# Patient Record
Sex: Male | Born: 1960 | Race: White | Hispanic: No | Marital: Married | Smoking: Current every day smoker
Health system: Southern US, Community
[De-identification: ages and names within clinical notes are randomized; demographics above are authoritative.]

## PROBLEM LIST (undated history)

## (undated) HISTORY — PX: LITHOTRIPSY: SUR834

---

## 2012-12-03 ENCOUNTER — Encounter: Payer: Self-pay | Admitting: Sports Medicine

## 2012-12-03 ENCOUNTER — Ambulatory Visit (INDEPENDENT_AMBULATORY_CARE_PROVIDER_SITE_OTHER): Admitting: Sports Medicine

## 2012-12-03 VITALS — BP 134/90 | HR 118 | Ht 68.0 in | Wt 193.0 lb

## 2012-12-03 DIAGNOSIS — R635 Abnormal weight gain: Secondary | ICD-10-CM | POA: Insufficient documentation

## 2012-12-03 DIAGNOSIS — K602 Anal fissure, unspecified: Secondary | ICD-10-CM | POA: Insufficient documentation

## 2012-12-03 DIAGNOSIS — Z299 Encounter for prophylactic measures, unspecified: Secondary | ICD-10-CM | POA: Insufficient documentation

## 2012-12-03 DIAGNOSIS — M25529 Pain in unspecified elbow: Secondary | ICD-10-CM

## 2012-12-03 DIAGNOSIS — F17200 Nicotine dependence, unspecified, uncomplicated: Secondary | ICD-10-CM | POA: Insufficient documentation

## 2012-12-03 DIAGNOSIS — M25522 Pain in left elbow: Secondary | ICD-10-CM | POA: Insufficient documentation

## 2012-12-03 MED ORDER — HYDROCORTISONE ACETATE 25 MG RE SUPP: 25.0000 mg | Freq: Two times a day (BID) | RECTAL | Status: DC | PRN

## 2012-12-03 MED ORDER — MELOXICAM 15 MG PO TABS: ORAL_TABLET | ORAL | Status: DC

## 2012-12-03 MED ORDER — PREDNISONE 50 MG PO TABS: ORAL_TABLET | ORAL | Status: DC

## 2012-12-03 MED ORDER — BUPROPION HCL ER (XL) 150 MG PO TB24: 150.0000 mg | ORAL_TABLET | ORAL | Status: DC

## 2012-12-03 MED ORDER — HYDROCORTISONE ACE-PRAMOXINE 1-1 % RE FOAM: 1.0000 | Freq: Two times a day (BID) | RECTAL | Status: DC

## 2012-12-03 NOTE — Assessment & Plan Note (Signed)
Strap with compressive bandage. Mobic, prednisone. X-rays. Return in 2-3 weeks, if no better I will inject the elbow joint.

## 2012-12-03 NOTE — Assessment & Plan Note (Signed)
Continue MiraLax, adding Anusol suppositories and ProctoFoam.

## 2012-12-03 NOTE — Assessment & Plan Note (Signed)
Colonoscopy was normal 5 months ago. Checking routine blood work.

## 2012-12-03 NOTE — Progress Notes (Signed)
  Subjective:    CC: Establish care.   HPI:  Left elbow pain: Localized between the epicondyles of the radial head, getting worse over the past week, no trauma, no overuse. No prior URI symptoms, has been using some Percocet that he had left over. It is localized, doesn't radiate, moderate.  Smoker: One pack a day, eager to quit, failed Chantix.  Anal fissure: Resolved in the past with Anusol suppositories and some kind of anal cream.  Weight gain: Has been prescribed phentermine by another provider, he is not obese.  Past medical history, Surgical history, Family history not pertinant except as noted below, Social history, Allergies, and medications have been entered into the medical record, reviewed, and no changes needed.   Review of Systems: No headache, visual changes, nausea, vomiting, diarrhea, constipation, dizziness, abdominal pain, skin rash, fevers, chills, night sweats, swollen lymph nodes, weight loss, chest pain, body aches, joint swelling, muscle aches, shortness of breath, mood changes, visual or auditory hallucinations.  Objective:    General: Well Developed, well nourished, and in no acute distress.  Neuro: Alert and oriented x3, extra-ocular muscles intact, sensation grossly intact.  HEENT: Normocephalic, atraumatic, pupils equal round reactive to light, neck supple, no masses, no lymphadenopathy, thyroid nonpalpable.  Skin: Warm and dry, no rashes noted.  Cardiac: Regular rate and rhythm, no murmurs rubs or gallops.  Respiratory: Clear to auscultation bilaterally. Not using accessory muscles, speaking in full sentences.  Abdominal: Soft, nontender, nondistended, positive bowel sounds, no masses, no organomegaly.  Left Elbow: Mildly full to inspection. Extension has about 5 of lag limited by pain. Strength is full to all of the above directions Stable to varus, valgus stress. Negative moving valgus stress test. Minimally tender to palpation at the joint line. Ulnar  nerve does not sublux. Negative cubital tunnel Tinel's. Impression and Recommendations:    The patient was counselled, risk factors were discussed, anticipatory guidance given.

## 2012-12-03 NOTE — Assessment & Plan Note (Signed)
Starting Wellbutrin XL at 150 mg daily. Has already trying Chantix and nicotine replacement.

## 2012-12-03 NOTE — Assessment & Plan Note (Signed)
Though he is not obese, he is being prescribed phentermine by a local physician.

## 2012-12-14 LAB — LIPID PANEL
Cholesterol: 148 mg/dL (ref 0–200)
HDL: 36 mg/dL — ABNORMAL LOW (ref >39)
LDL Cholesterol: 87 mg/dL (ref 0–99)
Total CHOL/HDL Ratio: 4.1 Ratio
Triglycerides: 124 mg/dL (ref <150)
VLDL: 25 mg/dL (ref 0–40)

## 2012-12-14 LAB — COMPREHENSIVE METABOLIC PANEL
Albumin: 4.2 g/dL (ref 3.5–5.2)
BUN: 16 mg/dL (ref 6–23)
CO2: 24 mEq/L (ref 19–32)
Calcium: 9.6 mg/dL (ref 8.4–10.5)
Chloride: 104 mEq/L (ref 96–112)
Creat: 1.05 mg/dL (ref 0.50–1.35)
Glucose, Bld: 82 mg/dL (ref 70–99)
Potassium: 4.3 mEq/L (ref 3.5–5.3)

## 2012-12-14 LAB — CBC
HCT: 46.5 % (ref 39.0–52.0)
Hemoglobin: 16 g/dL (ref 13.0–17.0)
MCH: 31.3 pg (ref 26.0–34.0)
MCHC: 34.4 g/dL (ref 30.0–36.0)
MCV: 91 fL (ref 78.0–100.0)
Platelets: 185 K/uL (ref 150–400)
RBC: 5.11 MIL/uL (ref 4.22–5.81)
RDW: 13.4 % (ref 11.5–15.5)
WBC: 9.3 10*3/uL (ref 4.0–10.5)

## 2012-12-14 LAB — COMPREHENSIVE METABOLIC PANEL WITH GFR
ALT: 15 U/L (ref 0–53)
AST: 13 U/L (ref 0–37)
Alkaline Phosphatase: 42 U/L (ref 39–117)
Sodium: 138 meq/L (ref 135–145)
Total Bilirubin: 0.7 mg/dL (ref 0.3–1.2)
Total Protein: 6.9 g/dL (ref 6.0–8.3)

## 2012-12-14 LAB — TSH: TSH: 1.48 u[IU]/mL (ref 0.350–4.500)

## 2012-12-15 LAB — VITAMIN D 25 HYDROXY (VIT D DEFICIENCY, FRACTURES): Vit D, 25-Hydroxy: 30 ng/mL (ref 30–89)

## 2012-12-15 LAB — TESTOSTERONE, FREE, TOTAL, SHBG
Sex Hormone Binding: 32 nmol/L (ref 13–71)
Testosterone, Free: 89.4 pg/mL (ref 47.0–244.0)
Testosterone-% Free: 2.1 % (ref 1.6–2.9)
Testosterone: 428 ng/dL (ref 300–890)

## 2012-12-16 ENCOUNTER — Ambulatory Visit (HOSPITAL_BASED_OUTPATIENT_CLINIC_OR_DEPARTMENT_OTHER)
Admission: RE | Admit: 2012-12-16 | Discharge: 2012-12-16 | Disposition: A | Discharge status: Home / Self Care | Source: Ambulatory Visit | Attending: Sports Medicine | Admitting: Sports Medicine

## 2012-12-16 DIAGNOSIS — M25529 Pain in unspecified elbow: Secondary | ICD-10-CM | POA: Insufficient documentation

## 2012-12-16 DIAGNOSIS — M25522 Pain in left elbow: Secondary | ICD-10-CM

## 2012-12-17 ENCOUNTER — Encounter: Payer: Self-pay | Admitting: Sports Medicine

## 2012-12-17 ENCOUNTER — Ambulatory Visit (INDEPENDENT_AMBULATORY_CARE_PROVIDER_SITE_OTHER): Admitting: Sports Medicine

## 2012-12-17 VITALS — BP 130/90 | HR 108 | Wt 188.0 lb

## 2012-12-17 DIAGNOSIS — K602 Anal fissure, unspecified: Secondary | ICD-10-CM

## 2012-12-17 DIAGNOSIS — M25522 Pain in left elbow: Secondary | ICD-10-CM

## 2012-12-17 DIAGNOSIS — M25529 Pain in unspecified elbow: Secondary | ICD-10-CM

## 2012-12-17 MED ORDER — HYDROCORTISONE ACE-PRAMOXINE 1-1 % RE FOAM: 1.0000 | Freq: Two times a day (BID) | RECTAL | Status: DC

## 2012-12-17 NOTE — Assessment & Plan Note (Signed)
Improve significantly with prednisone, compression. Still has minimal pain, and some popping. Elbow joint injected as above. Return in one month, MRI if no better.

## 2012-12-17 NOTE — Progress Notes (Signed)
  Subjective:    CC: Followup  HPI: This very pleasant 52 year old male comes back for followup of his elbow pain. His pain is significantly better, after prednisone and compressive wrap, he still notes some popping and catching. He denies any trauma. Symptoms are localized, stable, no radiation, mild.  Anal fissure: Improved significantly with ProctoFoam, still looking for the other cream that he got from another provider. He will let me know.  Past medical history, Surgical history, Family history not pertinant except as noted below, Social history, Allergies, and medications have been entered into the medical record, reviewed, and no changes needed.   Review of Systems: No fevers, chills, night sweats, weight loss, chest pain, or shortness of breath.   Objective:    General: Well Developed, well nourished, and in no acute distress.  Neuro: Alert and oriented x3, extra-ocular muscles intact, sensation grossly intact.  HEENT: Normocephalic, atraumatic, pupils equal round reactive to light, neck supple, no masses, no lymphadenopathy, thyroid nonpalpable.  Skin: Warm and dry, no rashes. Cardiac: Regular rate and rhythm, no murmurs rubs or gallops, no lower extremity edema.  Respiratory: Clear to auscultation bilaterally. Not using accessory muscles, speaking in full sentences. Left Elbow: Unremarkable to inspection. Range of motion full pronation, supination, flexion, extension. Strength is full to all of the above directions Stable to varus, valgus stress. Negative moving valgus stress test. No discrete areas of tenderness to palpation. Ulnar nerve does not sublux. Negative cubital tunnel Tinel's.  Procedure:  Injection of left elbow Consent obtained and verified. Time-out conducted. Noted no overlying erythema, induration, or other signs of local infection. Skin prepped in a sterile fashion. Topical analgesic spray: Ethyl chloride. Completed without difficulty. Meds: Needle  advanced through anconeus muscle between the radial head and olecranon.  1 cc Kenalog 40, 3 cc lidocaine injected easily. Pain immediately improved suggesting accurate placement of the medication. Advised to call if fevers/chills, erythema, induration, drainage, or persistent bleeding.  Impression and Recommendations:

## 2012-12-17 NOTE — Assessment & Plan Note (Signed)
Doing better, needs refill on proctofoam. Getting the name of the other topical medication from his surgeons.

## 2012-12-18 ENCOUNTER — Telehealth: Payer: Self-pay | Admitting: *Deleted

## 2012-12-18 MED ORDER — NIFEDIPINE POWD: Status: DC

## 2012-12-18 MED ORDER — HYDROCORTISONE ACE-PRAMOXINE 2.5-1 % RE CREA: TOPICAL_CREAM | Freq: Three times a day (TID) | RECTAL | Status: DC

## 2012-12-18 NOTE — Telephone Encounter (Signed)
Pt states you said if he called back with the name of these meds that you would call them in for him. States to call it in to [facility] b/c they do compound meds.  Nifedipine 0.2% TID and Ana;pram 2.5% 5 times daily.

## 2012-12-18 NOTE — Telephone Encounter (Signed)
Done

## 2013-01-25 ENCOUNTER — Ambulatory Visit: Admitting: Sports Medicine

## 2013-01-25 DIAGNOSIS — Z0289 Encounter for other administrative examinations: Secondary | ICD-10-CM

## 2013-10-18 ENCOUNTER — Ambulatory Visit (INDEPENDENT_AMBULATORY_CARE_PROVIDER_SITE_OTHER)

## 2013-10-18 ENCOUNTER — Ambulatory Visit (INDEPENDENT_AMBULATORY_CARE_PROVIDER_SITE_OTHER): Admitting: Sports Medicine

## 2013-10-18 ENCOUNTER — Encounter: Payer: Self-pay | Admitting: Sports Medicine

## 2013-10-18 VITALS — BP 138/98 | HR 103 | Ht 69.0 in | Wt 201.0 lb

## 2013-10-18 DIAGNOSIS — M25519 Pain in unspecified shoulder: Secondary | ICD-10-CM

## 2013-10-18 DIAGNOSIS — M25512 Pain in left shoulder: Secondary | ICD-10-CM | POA: Insufficient documentation

## 2013-10-18 DIAGNOSIS — M47812 Spondylosis without myelopathy or radiculopathy, cervical region: Secondary | ICD-10-CM

## 2013-10-18 MED ORDER — PREDNISONE 50 MG PO TABS: ORAL_TABLET | ORAL | Status: DC

## 2013-10-18 MED ORDER — CYCLOBENZAPRINE HCL 10 MG PO TABS: ORAL_TABLET | ORAL | Status: DC

## 2013-10-18 NOTE — Progress Notes (Deleted)

## 2013-10-18 NOTE — Assessment & Plan Note (Signed)
Positive whistle blower and crank tests suggest labral pathology. Symptoms radiating down the arm in a C7 nerve combined with triceps weakness also suggests C7 radiculopathy. At this point we are going to obtain x-rays of the shoulder and cervical spine, formal physical therapy, prednisone, Flexeril. Return to see me in one month, advanced imaging and intervention will depend predominately on how the symptoms have localized.

## 2013-10-18 NOTE — Progress Notes (Signed)
  Subjective:    CC: L shoulder pain  HPI: Patient is a pleasant 53 yo male with a 5 week history of left shoulder pain. The pain has gotten worse in the past week and a half. He describes the pain as a throbbing pain with the weight of his arm pulling down causing most of the pain. He also describes intermittent sharp stabbing pain and muscle spasm, which comes down his arm to the 3rd and 4th phalanges. He is most comfortable with his arm overhead. He has tried to put his arm in a sling for a few days with no relief. Denies neck pain, numbness, or tingling.   Past medical history, Surgical history, Family history not pertinant except as noted below, Social history, Allergies, and medications have been entered into the medical record, reviewed, and no changes needed.   Review of Systems: No fevers, chills, night sweats, weight loss, chest pain, or shortness of breath.   Objective:    General: Well Developed, well nourished, and in no acute distress.  Neuro: Alert and oriented x3, extra-ocular muscles intact, sensation grossly intact.  HEENT: Normocephalic, atraumatic, pupils equal round reactive to light, neck supple, no masses, no lymphadenopathy, thyroid nonpalpable.  Skin: Warm and dry, no rashes. Cardiac: Regular rate and rhythm, no murmurs rubs or gallops, no lower extremity edema.  Respiratory: Clear to auscultation bilaterally. Not using accessory muscles, speaking in full sentences. Neck: Inspection unremarkable. No palpable stepoffs. Negative Spurling's maneuver. Full neck range of motion Grip strength and sensation normal in bilateral hands Strength weak to left tricep/C7 Negative Hoffman sign bilaterally Reflexes normal Left Shoulder: Inspection reveals no abnormalities, atrophy or asymmetry. Palpation is normal with no tenderness over AC joint or bicipital groove. ROM is full in all planes. Rotator cuff strength normal throughout. No signs of impingement with negative  Neer and Hawkin's tests, empty can sign. Speeds and Yergason's tests normal. Positive crank test, negative O'Brien's test, negative CLOtest, 1+ positive anterior translational instability. Normal scapular function observed. No painful arc and no drop arm sign. No apprehension sign  Shoulder x-rays are unremarkable, neck x-rays do show C6-C7 degenerative changes.  Impression and Recommendations:

## 2013-10-28 ENCOUNTER — Ambulatory Visit (INDEPENDENT_AMBULATORY_CARE_PROVIDER_SITE_OTHER): Admitting: Physical Therapy

## 2013-10-28 DIAGNOSIS — M25519 Pain in unspecified shoulder: Secondary | ICD-10-CM

## 2013-10-28 DIAGNOSIS — M6281 Muscle weakness (generalized): Secondary | ICD-10-CM

## 2013-10-28 DIAGNOSIS — R293 Abnormal posture: Secondary | ICD-10-CM

## 2013-11-04 ENCOUNTER — Encounter (INDEPENDENT_AMBULATORY_CARE_PROVIDER_SITE_OTHER): Admitting: Physical Therapy

## 2013-11-04 DIAGNOSIS — R293 Abnormal posture: Secondary | ICD-10-CM

## 2013-11-04 DIAGNOSIS — M25519 Pain in unspecified shoulder: Secondary | ICD-10-CM

## 2013-11-04 DIAGNOSIS — M6281 Muscle weakness (generalized): Secondary | ICD-10-CM

## 2013-11-11 ENCOUNTER — Telehealth: Payer: Self-pay | Admitting: *Deleted

## 2013-11-11 ENCOUNTER — Ambulatory Visit (INDEPENDENT_AMBULATORY_CARE_PROVIDER_SITE_OTHER): Admitting: Sports Medicine

## 2013-11-11 ENCOUNTER — Encounter

## 2013-11-11 ENCOUNTER — Encounter: Payer: Self-pay | Admitting: Sports Medicine

## 2013-11-11 VITALS — BP 127/78 | HR 94 | Ht 69.0 in | Wt 201.0 lb

## 2013-11-11 DIAGNOSIS — M25519 Pain in unspecified shoulder: Secondary | ICD-10-CM

## 2013-11-11 DIAGNOSIS — M25512 Pain in left shoulder: Secondary | ICD-10-CM

## 2013-11-11 NOTE — Telephone Encounter (Signed)
No PA needed for MRI LT Shoulder.  Meyer CoryMisty Ahmad, LPN

## 2013-11-11 NOTE — Assessment & Plan Note (Signed)
I think Carl Payne had both left cervical radiculopathy as well as internal derangement of the shoulder joint. His radicular symptoms have resolved with prednisone and Flexeril however he continues to have posterior joint line pain. He does have several positive labile signs including a positive crank test, positive O'Brien test, and positive clunk test. At this point we do need an MR arthrogram of his left shoulder. I'm going to order the MRI and we need to schedule him one hour before the MRI for intra-articular injection.

## 2013-11-11 NOTE — Progress Notes (Signed)
  Subjective:    CC: Followup  HPI: Left shoulder pain: Initial diagnosis was difficult, he had left C7 radicular symptoms, as well as cervical degenerative changes on x-ray, he also had some positive labral signs on shoulder exam. We treated him aggressively with steroids, muscle relaxers, NSAIDs. We also did some rehabilitation. All radicular symptoms have resolved, unfortunately he continues to have pain of the posterior joint line of his left shoulder. It's worse with reaching-type activities and he does get mechanical symptoms. Moderate, persistent.  Past medical history, Surgical history, Family history not pertinant except as noted below, Social history, Allergies, and medications have been entered into the medical record, reviewed, and no changes needed.   Review of Systems: No fevers, chills, night sweats, weight loss, chest pain, or shortness of breath.   Objective:    General: Well Developed, well nourished, and in no acute distress.  Neuro: Alert and oriented x3, extra-ocular muscles intact, sensation grossly intact.  HEENT: Normocephalic, atraumatic, pupils equal round reactive to light, neck supple, no masses, no lymphadenopathy, thyroid nonpalpable.  Skin: Warm and dry, no rashes. Cardiac: Regular rate and rhythm, no murmurs rubs or gallops, no lower extremity edema.  Respiratory: Clear to auscultation bilaterally. Not using accessory muscles, speaking in full sentences. Left Shoulder: Inspection reveals no abnormalities, atrophy or asymmetry. Palpation is normal with no tenderness over AC joint or bicipital groove. ROM is full in all planes. Rotator cuff strength normal throughout. No signs of impingement with negative Neer and Hawkin's tests, empty can sign. Speeds and Yergason's tests normal. Positive O'Brien's test, positive clunk test, positive crank test Negative apprehension sign.   Impression and Recommendations:

## 2013-11-12 ENCOUNTER — Ambulatory Visit: Admitting: Sports Medicine

## 2013-12-01 ENCOUNTER — Encounter: Payer: Self-pay | Admitting: Sports Medicine

## 2013-12-01 ENCOUNTER — Ambulatory Visit (INDEPENDENT_AMBULATORY_CARE_PROVIDER_SITE_OTHER): Admitting: Sports Medicine

## 2013-12-01 ENCOUNTER — Ambulatory Visit (INDEPENDENT_AMBULATORY_CARE_PROVIDER_SITE_OTHER)

## 2013-12-01 VITALS — BP 143/87 | HR 105 | Ht 68.0 in | Wt 201.0 lb

## 2013-12-01 DIAGNOSIS — M25512 Pain in left shoulder: Secondary | ICD-10-CM

## 2013-12-01 DIAGNOSIS — M25519 Pain in unspecified shoulder: Secondary | ICD-10-CM

## 2013-12-01 DIAGNOSIS — X58XXXA Exposure to other specified factors, initial encounter: Secondary | ICD-10-CM

## 2013-12-01 DIAGNOSIS — S46819A Strain of other muscles, fascia and tendons at shoulder and upper arm level, unspecified arm, initial encounter: Secondary | ICD-10-CM

## 2013-12-01 DIAGNOSIS — S43499A Other sprain of unspecified shoulder joint, initial encounter: Secondary | ICD-10-CM

## 2013-12-01 NOTE — Progress Notes (Signed)
   Procedure: Real-time Ultrasound Guided gadolinium contrast injection of  Device: GE Logiq E  Verbal informed consent obtained.  Time-out conducted.  Noted no overlying erythema, induration, or other signs of local infection.  Skin prepped in a sterile fashion.  Local anesthesia: Topical Ethyl chloride.  With sterile technique and under real time ultrasound guidance:  Spinal needle advanced to the glenohumeral joint, 1 cc Kenalog 40, 4 cc lidocaine injected easily, and capsule seen distending confirming placement of medication, syringe switched and 0.1 cc of dilute gadolinium injected into the joint, syringe again switched and approximately 5 cc of normal saline used to flush the needle. Joint visualized and capsule seen distending confirming intra-articular placement of contrast material and medication. Completed without difficulty  Advised to call if fevers/chills, erythema, induration, drainage, or persistent bleeding.  Images permanently stored and available for review in the ultrasound unit.  Impression: Technically successful ultrasound guided gadolinium contrast injection for MR arthrography.  Please see separate MR arthrogram report.

## 2013-12-01 NOTE — Assessment & Plan Note (Signed)
Carl Payne had both radicular signs and intrinsic shoulder signs. Radicular symptoms resolved with steroids and muscle relaxers, he did have persistent labral signs. Injection for MR arthrogram performed today, we will await official report.

## 2014-07-12 ENCOUNTER — Ambulatory Visit: Admitting: Sports Medicine

## 2014-07-19 ENCOUNTER — Ambulatory Visit: Admitting: Sports Medicine

## 2014-07-20 ENCOUNTER — Ambulatory Visit (INDEPENDENT_AMBULATORY_CARE_PROVIDER_SITE_OTHER): Admitting: Sports Medicine

## 2014-07-20 ENCOUNTER — Encounter: Payer: Self-pay | Admitting: Sports Medicine

## 2014-07-20 VITALS — BP 140/92 | HR 98 | Ht 69.0 in | Wt 200.0 lb

## 2014-07-20 DIAGNOSIS — M25512 Pain in left shoulder: Secondary | ICD-10-CM

## 2014-07-20 DIAGNOSIS — N529 Male erectile dysfunction, unspecified: Secondary | ICD-10-CM | POA: Insufficient documentation

## 2014-07-20 DIAGNOSIS — N528 Other male erectile dysfunction: Secondary | ICD-10-CM

## 2014-07-20 DIAGNOSIS — H6992 Unspecified Eustachian tube disorder, left ear: Secondary | ICD-10-CM

## 2014-07-20 DIAGNOSIS — H6982 Other specified disorders of Eustachian tube, left ear: Secondary | ICD-10-CM

## 2014-07-20 DIAGNOSIS — E785 Hyperlipidemia, unspecified: Secondary | ICD-10-CM | POA: Insufficient documentation

## 2014-07-20 DIAGNOSIS — H6990 Unspecified Eustachian tube disorder, unspecified ear: Secondary | ICD-10-CM | POA: Insufficient documentation

## 2014-07-20 DIAGNOSIS — H6980 Other specified disorders of Eustachian tube, unspecified ear: Secondary | ICD-10-CM | POA: Insufficient documentation

## 2014-07-20 MED ORDER — SILDENAFIL CITRATE 20 MG PO TABS: 20.0000 mg | ORAL_TABLET | ORAL | Status: DC | PRN

## 2014-07-20 MED ORDER — FLUTICASONE PROPIONATE 50 MCG/ACT NA SUSP: NASAL | Status: DC

## 2014-07-20 MED ORDER — ATORVASTATIN CALCIUM 20 MG PO TABS: 20.0000 mg | ORAL_TABLET | Freq: Every day | ORAL | Status: DC

## 2014-07-20 MED ORDER — PREDNISONE 50 MG PO TABS: 50.0000 mg | ORAL_TABLET | Freq: Every day | ORAL | Status: DC

## 2014-07-20 NOTE — Assessment & Plan Note (Signed)
Tested in an outside facility, LDL 150, total cholesterol 220, HDL 50. Starting low-dose Lipitor. We can recheck this in 3 months.

## 2014-07-20 NOTE — Assessment & Plan Note (Signed)
We'll start conservatively with prednisone, Flonase, he can continue Allegra-D. If no improvement in a couple of weeks we will refer to ENT for consideration of myringotomy tube placement.

## 2014-07-20 NOTE — Progress Notes (Signed)
  Subjective:    CC: Left ear fullness  HPI: For the past several weeks this pleasant 53 year old male has noted increasing fullness with an odd sensation in his hearing in his left ear. He started some Allegra-D with a minimal response. Symptoms are moderate, persistent, he recently had a flight, and had fairly severe pain during the descent.  Left shoulder pain: Completely resolved after intra-articular injection during MR arthrogram procedure. This was 6 months ago.  Erectile dysfunction: Desires Viagra.  Past medical history, Surgical history, Family history not pertinant except as noted below, Social history, Allergies, and medications have been entered into the medical record, reviewed, and no changes needed.   Review of Systems: No fevers, chills, night sweats, weight loss, chest pain, or shortness of breath.   Objective:    General: Well Developed, well nourished, and in no acute distress.  Neuro: Alert and oriented x3, extra-ocular muscles intact, sensation grossly intact.  HEENT: Normocephalic, atraumatic, pupils equal round reactive to light, neck supple, no masses, no lymphadenopathy, thyroid nonpalpable. Oropharynx, nasopharynx, ear canals are unremarkable. Skin: Warm and dry, no rashes. Cardiac: Regular rate and rhythm, no murmurs rubs or gallops, no lower extremity edema.  Respiratory: Clear to auscultation bilaterally. Not using accessory muscles, speaking in full sentences.  Impression and Recommendations:

## 2014-07-20 NOTE — Assessment & Plan Note (Signed)
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 Interestingly this resolved completely after his arthrogram injection approximately 6 months ago.

## 2014-07-20 NOTE — Assessment & Plan Note (Signed)
Viagra

## 2014-07-20 NOTE — Patient Instructions (Signed)
Barotitis Media Barotitis media is inflammation of your middle ear. This occurs when the auditory tube (eustachian tube) leading from the back of your nose (nasopharynx) to your eardrum is blocked. This blockage may result from a cold, environmental allergies, or an upper respiratory infection. Unresolved barotitis media may lead to damage or hearing loss (barotrauma), which may become permanent. HOME CARE INSTRUCTIONS   Use medicines as recommended by your health care provider. Over-the-counter medicines will help unblock the canal and can help during times of air travel.  Do not put anything into your ears to clean or unplug them. Eardrops will not be helpful.  Do not swim, dive, or fly until your health care provider says it is all right to do so. If these activities are necessary, chewing gum with frequent, forceful swallowing may help. It is also helpful to hold your nose and gently blow to pop your ears for equalizing pressure changes. This forces air into the eustachian tube.  Only take over-the-counter or prescription medicines for pain, discomfort, or fever as directed by your health care provider.  A decongestant may be helpful in decongesting the middle ear and make pressure equalization easier. SEEK MEDICAL CARE IF:  You experience a serious form of dizziness in which you feel as if the room is spinning and you feel nauseated (vertigo).  Your symptoms only involve one ear. SEEK IMMEDIATE MEDICAL CARE IF:   You develop a severe headache, dizziness, or severe ear pain.  You have bloody or pus-like drainage from your ears.  You develop a fever.  Your problems do not improve or become worse. MAKE SURE YOU:   Understand these instructions.  Will watch your condition.  Will get help right away if you are not doing well or get worse. Document Released: 07/19/2000 Document Revised: 05/12/2013 Document Reviewed: 02/16/2013 Exit[employer] Patient Information 2015 [employer]. This  information is not intended to replace advice given to you by your health care provider. Make sure you discuss any questions you have with your health care provider.  

## 2015-08-14 IMAGING — CR DG SHOULDER 2+V*L*
3 series · 3 of 3 positions shown · non-contrast
Comparison: None.

CLINICAL DATA: Left shoulder pain without injury.

EXAM:
LEFT SHOULDER - 2+ VIEW

[view not recorded (1 of 3)]
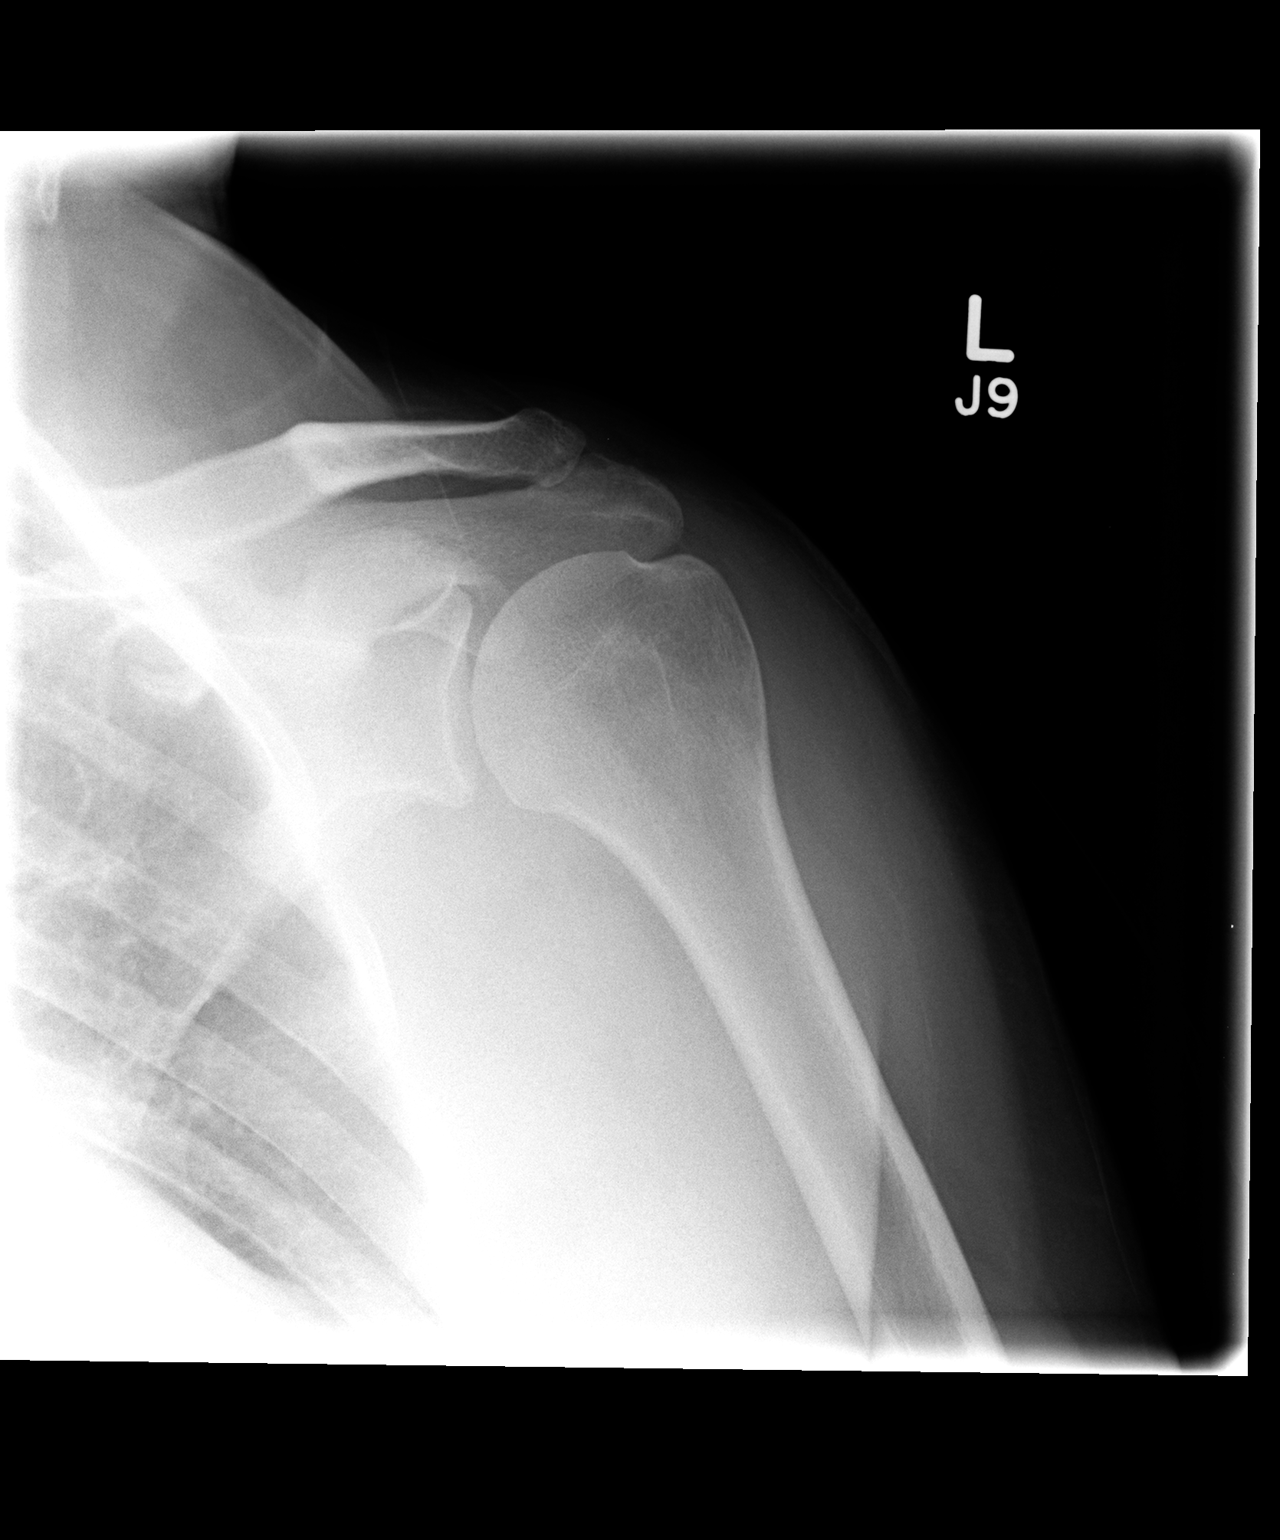

[view not recorded (2 of 3)]
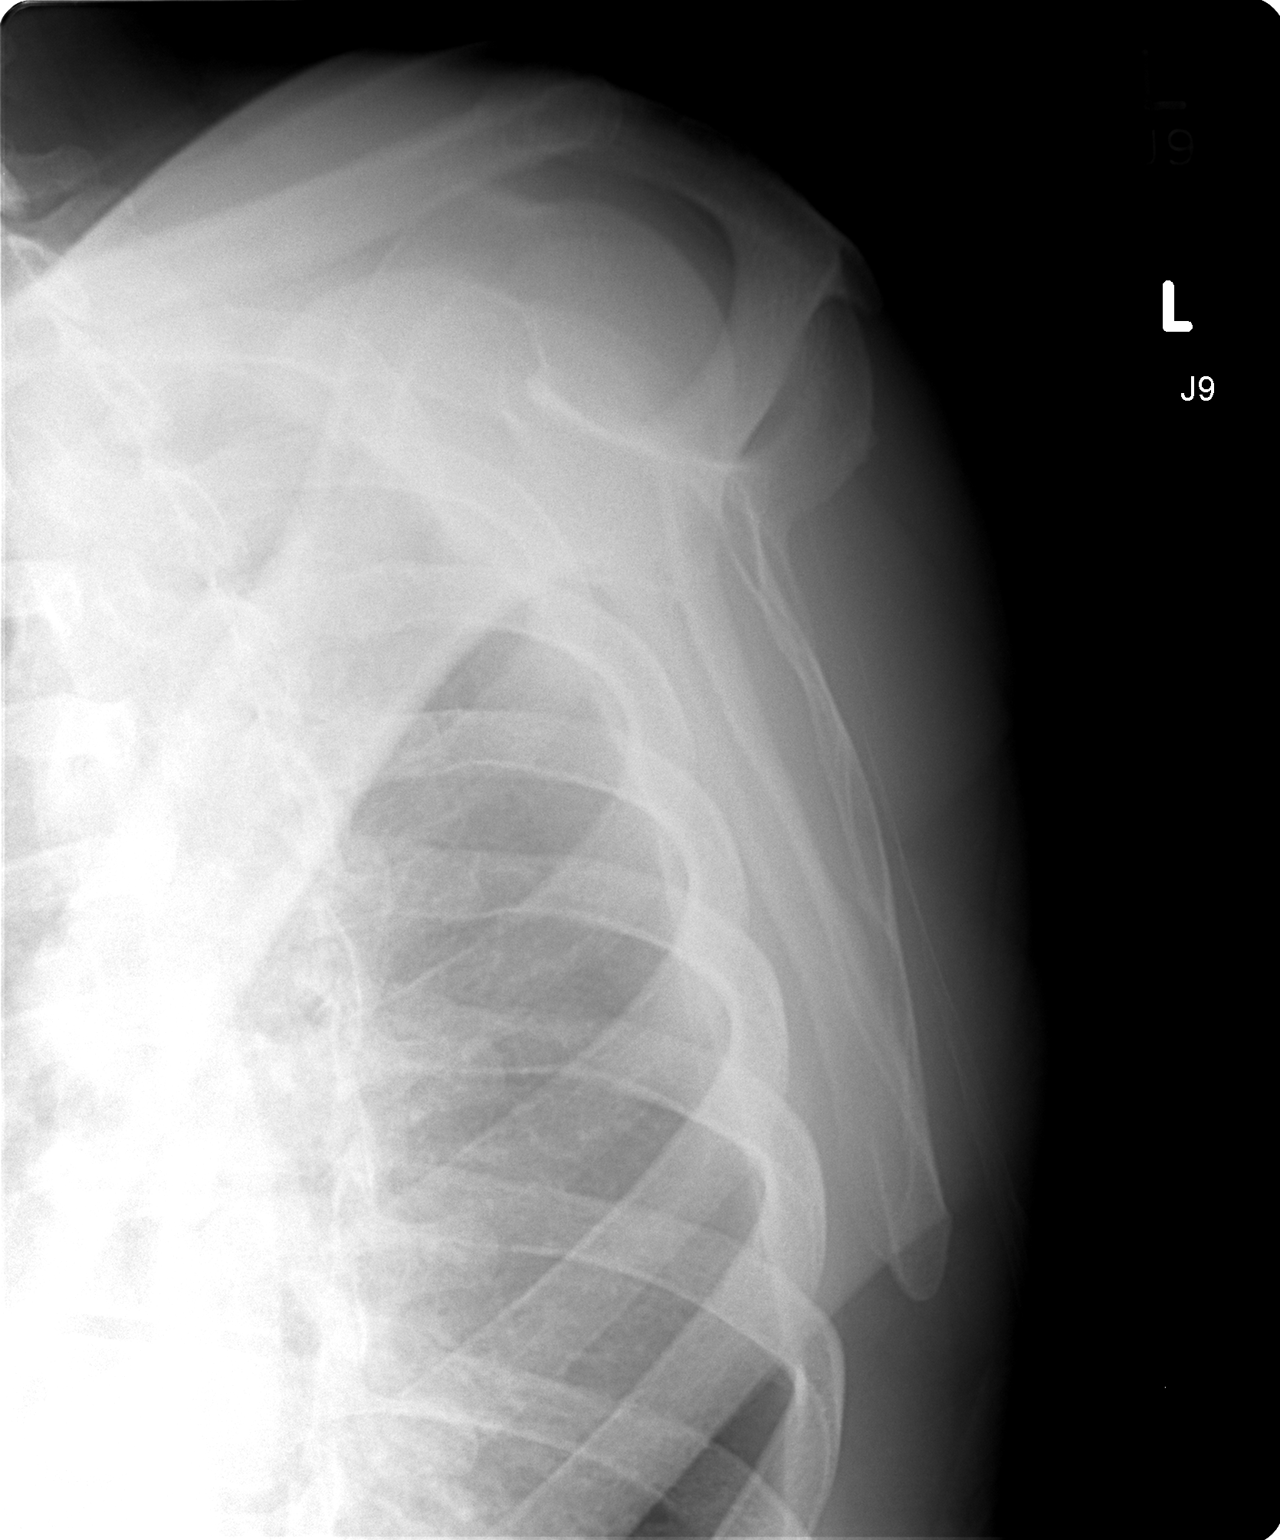

[view not recorded (3 of 3)]
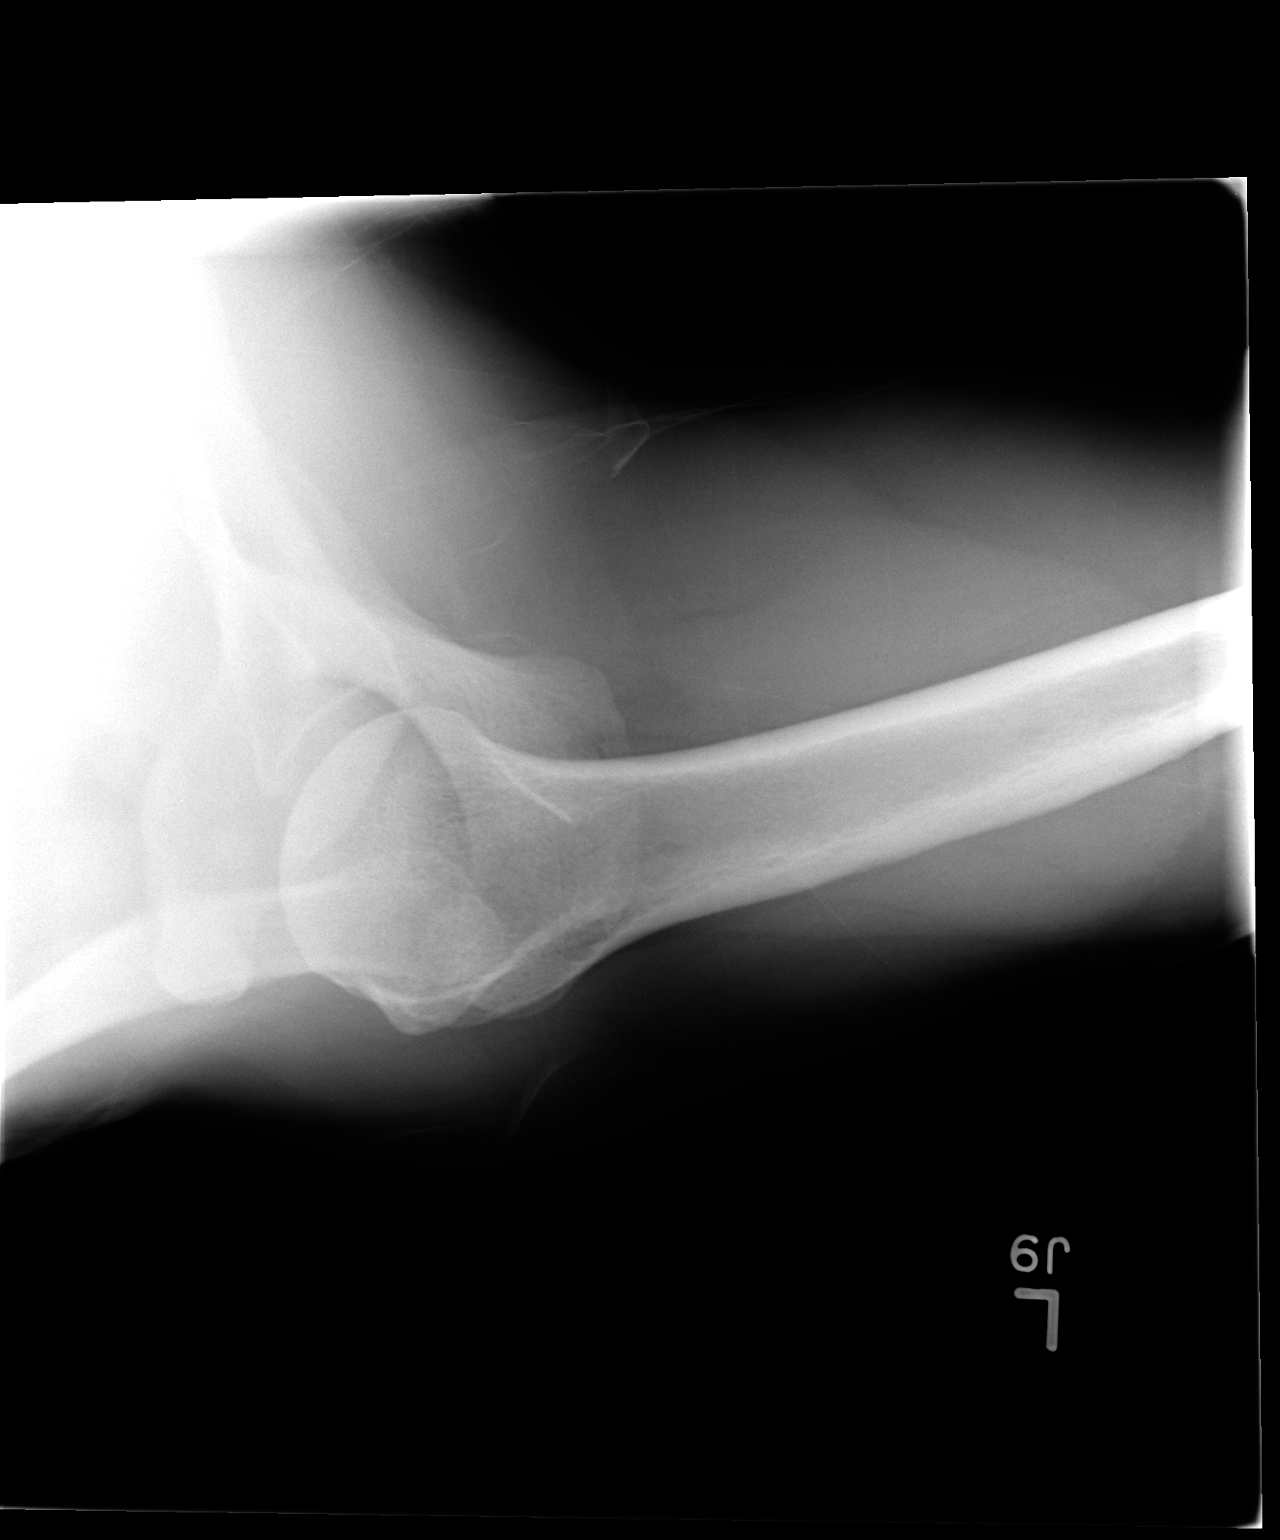

[3 of 3 positions shown; findings below may reference images not displayed]

FINDINGS: There is no evidence of fracture or dislocation. Visualized ribs
appear normal. There is no evidence of arthropathy or other focal
bone abnormality. Soft tissues are unremarkable.
IMPRESSION: Normal left shoulder.

## 2017-02-25 ENCOUNTER — Ambulatory Visit (INDEPENDENT_AMBULATORY_CARE_PROVIDER_SITE_OTHER): Payer: Managed Care, Other (non HMO) | Admitting: Sports Medicine

## 2017-02-25 ENCOUNTER — Encounter: Payer: Self-pay | Admitting: Sports Medicine

## 2017-02-25 DIAGNOSIS — M25512 Pain in left shoulder: Secondary | ICD-10-CM

## 2017-02-25 DIAGNOSIS — G8929 Other chronic pain: Secondary | ICD-10-CM | POA: Diagnosis not present

## 2017-02-25 NOTE — Progress Notes (Signed)
  Subjective:    CC: Left shoulder pain  HPI: This is a pleasant 56 year old male, haven't seen him for almost 3 years, we did an injection for an MRI arthrogram on his left shoulder, the arthrogram showed bicipital tendinitis and some glenohumeral and acromioclavicular degenerative changes, the injection itself provided near complete relief for 2-1/2 years. He is having a recurrence of pain, desires repeat interventional treatment today.  Past medical history:  Negative.  See flowsheet/record as well for more information.  Surgical history: Negative.  See flowsheet/record as well for more information.  Family history: Negative.  See flowsheet/record as well for more information.  Social history: Negative.  See flowsheet/record as well for more information.  Allergies, and medications have been entered into the medical record, reviewed, and no changes needed.   Review of Systems: No fevers, chills, night sweats, weight loss, chest pain, or shortness of breath.   Objective:    General: Well Developed, well nourished, and in no acute distress.  Neuro: Alert and oriented x3, extra-ocular muscles intact, sensation grossly intact.  HEENT: Normocephalic, atraumatic, pupils equal round reactive to light, neck supple, no masses, no lymphadenopathy, thyroid nonpalpable.  Skin: Warm and dry, no rashes. Cardiac: Regular rate and rhythm, no murmurs rubs or gallops, no lower extremity edema.  Respiratory: Clear to auscultation bilaterally. Not using accessory muscles, speaking in full sentences. Left Shoulder: Inspection reveals no abnormalities, atrophy or asymmetry. Palpation is normal with no tenderness over AC joint or bicipital groove. ROM is full in all planes. Rotator cuff strength normal throughout. No signs of impingement with negative Neer and Hawkin's tests, empty can. Speeds and Yergason's tests normal. Positive Obrien's, negative crank, negative clunk, and good stability. Normal  scapular function observed. No painful arc and no drop arm sign. No apprehension sign  Procedure: Real-time Ultrasound Guided Injection of left glenohumeral joint Device: GE Logiq E  Verbal informed consent obtained.  Time-out conducted.  Noted no overlying erythema, induration, or other signs of local infection.  Skin prepped in a sterile fashion.  Local anesthesia: Topical Ethyl chloride.  With sterile technique and under real time ultrasound guidance:  22-gauge spinal needle advanced into the joint, I then injected 1 mL kenalog 40, 2 mL lidocaine, 2 mL bupivacaine. Completed without difficulty  Pain immediately resolved suggesting accurate placement of the medication.  Advised to call if fevers/chills, erythema, induration, drainage, or persistent bleeding.  Images permanently stored and available for review in the ultrasound unit.  Impression: Technically successful ultrasound guided injection.  Impression and Recommendations:    Shoulder pain, left Good response 3 years ago to a glenohumeral injection for an MRI arthrogram. Arthrogram did show some acromioclavicular degenerative changes and bicipital tendinitis. Repeat left glenohumeral injection, return as needed, [place]

## 2017-02-25 NOTE — Assessment & Plan Note (Addendum)
Good response 3 years ago to a glenohumeral injection for an MRI arthrogram. Arthrogram did show some acromioclavicular degenerative changes and bicipital tendinitis. Repeat left glenohumeral injection, return as needed, [place] has moved to [place].

## 2022-09-05 ENCOUNTER — Encounter: Payer: Self-pay | Admitting: Podiatry

## 2022-09-05 ENCOUNTER — Ambulatory Visit (INDEPENDENT_AMBULATORY_CARE_PROVIDER_SITE_OTHER)

## 2022-09-05 ENCOUNTER — Ambulatory Visit (INDEPENDENT_AMBULATORY_CARE_PROVIDER_SITE_OTHER): Admitting: Podiatry

## 2022-09-05 DIAGNOSIS — M722 Plantar fascial fibromatosis: Secondary | ICD-10-CM

## 2022-09-05 DIAGNOSIS — M778 Other enthesopathies, not elsewhere classified: Secondary | ICD-10-CM

## 2022-09-05 MED ORDER — MELOXICAM 15 MG PO TABS: 15.0000 mg | ORAL_TABLET | Freq: Every day | ORAL | 0 refills | Status: DC

## 2022-09-05 NOTE — Progress Notes (Signed)
  Subjective:  Patient ID: Carl Payne, male    DOB: 26-Jul-1961,   MRN: [id number]  Chief Complaint  Patient presents with   Foot Pain    bil heel pain ( right worse). Pain started about 6 months ago. Pain is only in the heel and does not radiate. Worst in the mornings. Patient has been stretching and using inserts. No injuries.     62 y.o. male presents for concern of bilateral foot pain with right being worse. This has been going on for about 6 months. States the first steps in the morning are the worse. Has tried stretching and wearing Dr. Zoe Lan inserts with minimal relief. He works on his feet a lot and that worsens the pain . Denies any other pedal complaints. Denies n/v/f/c.   History reviewed. No pertinent past medical history.  Objective:  Physical Exam: Vascular: DP/PT pulses 2/4 bilateral. CFT <3 seconds. Normal hair growth on digits. No edema.  Skin. No lacerations or abrasions bilateral feet.  Musculoskeletal: MMT 5/5 bilateral lower extremities in DF, PF, Inversion and Eversion. Deceased ROM in DF of ankle joint.  Tender to medial calcaneal tubercle on the right and some on the left. No pain along arch achilles, PT tendon and no pain with calcaneal squeeze.  Neurological: Sensation intact to light touch.   Assessment:   1. Plantar fasciitis of right foot   2. Plantar fasciitis, left      Plan:  Patient was evaluated and treated and all questions answered. X-rays reviewed and discussed with patient. No acute fractures or dislocations noted. No acute fractures or dislocations. Mild spurring noted to plantar calcaneus bilateral. Bipartite fibular sesamoid bilateral.  Discussed plantar fasciitis with patient.  Discussed treatment options including, ice, NSAIDS, supportive shoes, bracing, and stretching. Stretching exercises provided to be done on a daily basis.   Prescription for meloxicam provided and sent to pharmacy. Patient deferred injection today.  PF brace  dispensed.  Follow-up 6 weeks or sooner if any problems arise. In the meantime, encouraged to call the office with any questions, concerns, change in symptoms.     Lorenda Peck, DPM

## 2022-09-05 NOTE — Patient Instructions (Signed)

## 2022-10-02 ENCOUNTER — Other Ambulatory Visit: Payer: Self-pay | Admitting: Podiatry

## 2022-10-17 ENCOUNTER — Ambulatory Visit: Admitting: Podiatry

## 2022-10-29 ENCOUNTER — Other Ambulatory Visit: Payer: Self-pay | Admitting: Podiatry

## 2022-12-30 ENCOUNTER — Other Ambulatory Visit: Payer: Self-pay | Admitting: Podiatry

## 2023-03-02 ENCOUNTER — Other Ambulatory Visit: Payer: Self-pay | Admitting: Podiatry
